# Patient Record
Sex: Female | Born: 1970 | Race: White | Hispanic: No | Marital: Married | Smoking: Never smoker
Health system: Southern US, Community
[De-identification: ages and names within clinical notes are randomized; demographics above are authoritative.]

## PROBLEM LIST (undated history)

## (undated) HISTORY — PX: WISDOM TOOTH EXTRACTION: SHX21

---

## 1998-03-10 ENCOUNTER — Other Ambulatory Visit: Admission: RE | Admit: 1998-03-10 | Discharge: 1998-03-10 | Payer: Self-pay | Admitting: Obstetrics and Gynecology

## 1998-06-19 ENCOUNTER — Other Ambulatory Visit: Admission: RE | Admit: 1998-06-19 | Discharge: 1998-06-19 | Payer: Self-pay | Admitting: Obstetrics and Gynecology

## 1998-12-02 ENCOUNTER — Other Ambulatory Visit: Admission: RE | Admit: 1998-12-02 | Discharge: 1998-12-02 | Payer: Self-pay | Admitting: Obstetrics and Gynecology

## 1999-12-02 ENCOUNTER — Other Ambulatory Visit: Admission: RE | Admit: 1999-12-02 | Discharge: 1999-12-02 | Payer: Self-pay | Admitting: Obstetrics and Gynecology

## 2000-11-03 ENCOUNTER — Other Ambulatory Visit: Admission: RE | Admit: 2000-11-03 | Discharge: 2000-11-03 | Payer: Self-pay | Admitting: Obstetrics and Gynecology

## 2001-11-05 ENCOUNTER — Other Ambulatory Visit: Admission: RE | Admit: 2001-11-05 | Discharge: 2001-11-05 | Payer: Self-pay | Admitting: *Deleted

## 2002-11-18 ENCOUNTER — Other Ambulatory Visit: Admission: RE | Admit: 2002-11-18 | Discharge: 2002-11-18 | Payer: Self-pay | Admitting: Obstetrics and Gynecology

## 2003-02-19 ENCOUNTER — Inpatient Hospital Stay (HOSPITAL_COMMUNITY): Admission: AD | Admit: 2003-02-19 | Discharge: 2003-02-19 | Payer: Self-pay | Admitting: Obstetrics and Gynecology

## 2003-05-20 ENCOUNTER — Ambulatory Visit (HOSPITAL_COMMUNITY): Admission: RE | Admit: 2003-05-20 | Discharge: 2003-05-20 | Payer: Self-pay | Admitting: Obstetrics and Gynecology

## 2003-07-18 ENCOUNTER — Inpatient Hospital Stay (HOSPITAL_COMMUNITY): Admission: AD | Admit: 2003-07-18 | Discharge: 2003-07-18 | Payer: Self-pay | Admitting: Obstetrics and Gynecology

## 2003-09-23 ENCOUNTER — Encounter (INDEPENDENT_AMBULATORY_CARE_PROVIDER_SITE_OTHER): Payer: Self-pay | Admitting: *Deleted

## 2003-09-23 ENCOUNTER — Inpatient Hospital Stay (HOSPITAL_COMMUNITY): Admission: AD | Admit: 2003-09-23 | Discharge: 2003-09-25 | Payer: Self-pay | Admitting: Obstetrics and Gynecology

## 2003-09-29 ENCOUNTER — Encounter: Admission: RE | Admit: 2003-09-29 | Discharge: 2003-10-29 | Payer: Self-pay | Admitting: Obstetrics and Gynecology

## 2003-11-05 ENCOUNTER — Other Ambulatory Visit: Admission: RE | Admit: 2003-11-05 | Discharge: 2003-11-05 | Payer: Self-pay | Admitting: Obstetrics and Gynecology

## 2004-04-19 ENCOUNTER — Encounter: Admission: RE | Admit: 2004-04-19 | Discharge: 2004-04-19 | Payer: Self-pay | Admitting: Family Medicine

## 2004-11-08 ENCOUNTER — Other Ambulatory Visit: Admission: RE | Admit: 2004-11-08 | Discharge: 2004-11-08 | Payer: Self-pay | Admitting: Obstetrics and Gynecology

## 2005-11-08 ENCOUNTER — Other Ambulatory Visit: Admission: RE | Admit: 2005-11-08 | Discharge: 2005-11-08 | Payer: Self-pay | Admitting: Obstetrics and Gynecology

## 2008-05-09 ENCOUNTER — Inpatient Hospital Stay (HOSPITAL_COMMUNITY): Admission: AD | Admit: 2008-05-09 | Discharge: 2008-05-09 | Payer: Self-pay | Admitting: Obstetrics and Gynecology

## 2008-07-29 ENCOUNTER — Inpatient Hospital Stay (HOSPITAL_COMMUNITY): Admission: AD | Admit: 2008-07-29 | Discharge: 2008-07-31 | Payer: Self-pay | Admitting: Obstetrics and Gynecology

## 2010-04-11 LAB — CBC
MCHC: 35.1 g/dL (ref 30.0–36.0)
MCV: 95.6 fL (ref 78.0–100.0)

## 2010-04-11 LAB — RH IMMUNE GLOB WKUP(>/=20WKS)(NOT WOMEN'S HOSP): Fetal Screen: NEGATIVE

## 2010-04-11 LAB — RPR: RPR Ser Ql: NONREACTIVE

## 2010-04-13 LAB — RH IMMUNE GLOBULIN WORKUP (NOT WOMEN'S HOSP): Antibody Screen: NEGATIVE

## 2010-05-18 NOTE — H&P (Signed)
NAMECAROLY, PUREWAL        ACCOUNT NO.:  [id number]   MEDICAL RECORD NO.:  [id number]          PATIENT TYPE:  INP   LOCATION:  9111                          FACILITY:  [facility]   PHYSICIAN:  Janine Limbo, M.D.DATE OF BIRTH:  [birth year]   DATE OF ADMISSION:  07/29/2008  DATE OF DISCHARGE:                              HISTORY & PHYSICAL   Ms. Cooper Render is a 40 year old gravida 2, para 1 who presents today at  39.[redacted] weeks gestation having been sent from the office to where she had a  labor check and was found to be 6 cm dilated. old gravida 2, para 1 who presents today at  39.[redacted] weeks gestation having been sent from the office to where she had a  labor check and was found to be 6 cm dilated.  Ms. Cooper Render is followed  by the midwives at [facility] and her pregnancy is  remarkable for:  1. Advanced maternal age.  2. Increased BMI.  3. Early previa, resolved.  4. Rh negative.  5. History of GBS with the first baby.   PRENATAL LABORATORY DATA:  [name]'s prenatal labs include an  initial hemoglobin of 12.4, hematocrit 37.4, platelets 270,000, blood  type A negative, antibody screen negative, RPR nonreactive, rubella  immune, hepatitis B negative, HIV nonreactive, first trimester screen  negative.  Maternal serum alpha fetoprotein normal, 26-week Glucola  normal at 73 mg/dL with a concurrent hemoglobin of 11.3 and a  nonreactive RPR also at 26 weeks, Rh antibody screen negative at 27.6  weeks, and a negative GBS culture at 35.6 weeks.   ALLERGIES:  Ms. Salvo does not have any allergies to medication,  food, or latex.   CURRENT MEDICATIONS:  Ms. Row current medications include  prenatal vitamins only.   HISTORY OF PRESENT PREGNANCY:  Ms. Gloeckner began her prenatal care  in mid December early in the first trimester at about 5-[redacted] weeks  gestation.  She was seen at 8 weeks with an ultrasound.  She had her  complete new OB exam at 10 weeks.  She declined amniocentesis.  She did  have a first trimester screen that was normal at 13.4 weeks.  She had an  AFP that was normal at 14.5 weeks.  She had an  early previa that was  resolved by the time of the anatomy scan at 18.6 weeks, showed size  concordant with dates and the placenta now in posterior position.  Cervix 4.07 cm and normal amount of fluid, all anatomy normal at 26.1  weeks.  She had a normal Glucola.  Repeat hemoglobin and RPR all of  which were normal.  She received her RhoGAM injection at 27.6 weeks.  At  times, she would measure size less to date due to the position of the  baby who would sometimes get a little transverse.  At 35.6 weeks, she  had a negative GBS culture.  At 38.4 weeks, she was again measuring size  less than dates and ultrasound was done, which showed an estimated fetal  weight of 8 pounds 0 ounces in the 78th percentile and amniotic fluid  index of 15.14, which was in the 60th percentile, and the fetal head to  be wedged very low in the pelvis that was last week, and today, she  presented for her routine visit having some contractions and  was found  to be 6 cm and was sent over from the office to be admitted for  delivery.   OBSTETRICAL HISTORY:  Ms. Hunger has been pregnant 2 times.  Gravida 1 resulted in birth of a daughter Maggy who was born in  September 2005 at [redacted] weeks gestation weighing 6 pounds and 5 ounces,  delivered without complications.  Gravida 2 is current pregnancy.   MEDICAL HISTORY:  Ms. Lasky is Rh negative.  She did get her  RhoGAM, last pregnancy.  She had a history of contraceptive use  including combined oral contraceptive pill.  She occasionally gets  bacterial vaginosis and had the usual childhood illnesses.   SURGICAL HISTORY:  Ms. Dabbs had a wisdom teeth extraction in  1992.   FAMILY HISTORY:  Ms. Quintela father has chronic hypertension.  Her mother has varicose veins.  Her paternal grandfather and paternal  aunt both have diabetes.  Her paternal aunt has an unspecified thyroid  dysfunction.   GENETIC HISTORY:  Ms. Schlatter had a  normal first trimester screen  and a normal AFP.  The father of the baby's mother has spina bifida.   SOCIAL HISTORY:  Ms. Spiller is a Caucasian and married to [facility].  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  He has a bachelor's degree and works as a Museum/gallery conservator.  He is also Caucasian.  She reports some minor alcohol use  before knowing that she was pregnant.  She denies use of tobacco or  street drugs during the pregnancy.  She declines to state a religious  preference.  The physical assessment today includes a stable vital  signs.  The patient is afebrile.  Fetal heart rate is normal with  variability present and 15 x 15 accelerations present meeting the  criteria for reactivity.  Contractions are every 3-5 minutes with soft  uterine resting tone.  Lungs are clear to auscultation bilaterally.  Heart, regular rate and rhythm.  No murmur.  No edema of extremities.  Normal DTRs.  Abdomen, gravid, appropriate for gestational age, soft  between contractions to palpation.  Vaginal exam in the office, she was  6 cm.  It was not repeated upon admission.   IMPRESSION:  33. A 40 year old gravida 2, para 1-0-0-1 at 39.[redacted] weeks gestation.  2. Reassuring fetal heart rate.  3. Active labor.  4. Rh negative.  5. Group B streptococcus negative.   PLAN:  Admit to [facility], routine labs.  Anticipate NSVD.      Eulogio Bear, CNM      Janine Limbo, M.D.  Electronically Signed    JM/MEDQ  D:  07/29/2008  T:  07/30/2008  Job:  [id number]

## 2010-05-21 NOTE — H&P (Signed)
NAME:  Kristin Fritz, Kristin Fritz                  ACCOUNT NO.:  [id number]   MEDICAL RECORD NO.:  [id number]                   PATIENT TYPE:  MAT   LOCATION:  [facility]C                                 F[facility]ILITY:  [facility]   PHYSICIAN:  Janine Limbo, M.D.            DATE OF BIRTH:  07/24/1970   DATE OF ADMISSION:  09/23/2003  DATE OF DISCHARGE:                                HISTORY & PHYSICAL   HISTORY OF PRESENT ILLNESS:  Ms. Murton is a 40 year old married  white female, primigravida at 37-5/7 weeks, who presents with regular  uterine contractions during the night.  She denies leaking, bleeding,  headache, nausea, vomiting or visual disturbances.  Her pregnancy has been  followed by the [facility] certified nurse midwife service and  has been remarkable for:  #1 - Long cycles, #2 - first trimester bleeding,  #3 - Rh-negative, #4 - father of the baby with family history of anomalies,  #5 - group B strep positive.  Her prenatal labs were collected on March 11, 2003:  Hemoglobin 13.0, hematocrit 36.4, platelets 275,000.  Blood type A-  negative, antibody positive.  Toxoplasmosis negative.  RPR nonreactive.  Rubella immune.  Hepatitis B surface antigen negative.  HIV nonreactive.  Her 1-hour Glucola from June 15, 2003 was 72 and her hemoglobin was 12.3.  Culture of the vaginal tract for group B strep, gonorrhea and Chlamydia on  September 05, 2003:  Group B strep was positive, gonorrhea and Chlamydia were  negative.   HISTORY OF PRESENT PREGNANCY:  The patient presented for care at [facility] on March 11, 2003 at 9-1/2 weeks' gestation.  She had had first  trimester bleeding a few weeks prior and received RhoGAM.  Genetic  counseling was scheduled with the patient and the father of the baby due to  the father of the baby's family history of congenital anomalies.  Visit with  the genetic counselor showed no increased risks of anomalies.  Pregnancy  ultrasonography at 19  weeks' gestation shows growth consistent with previous  dating and all anatomy seen and was normal.  The patient received RhoGAM at  29 weeks' gestation.  The rest of her prenatal care was unremarkable.   OBSTETRICAL HISTORY:  She is a primigravida.   ALLERGIES:  She has no medication allergies.   MEDICAL HISTORY:  1.  She has used oral contraceptives in the past and stopped in February      2004.  2.  She has had BV twice.  3.  She has indoor cats.  4.  She reports having had the usual childhood illnesses.  5.  She broke her right arm at the age of 5.   SURGICAL HISTORY:  Surgical history is remarkable for wisdom teeth  extraction at the age of 22.   FAMILY MEDICAL HISTORY:  Family medical history is remarkable for paternal  grandmother with stroke, paternal grandfather with MI, father with  hypertension, mom with varicose  veins, paternal grandfather and aunt with  diabetes, aunt with thyroid disease, father with history of depression,  mother and paternal grandfather are alcoholics and smokers   GENETIC HISTORY:  Genetic history is remarkable for father of the baby's  mother with scoliosis and spina bifida, father of the baby's mother also  born with clubfoot, father of the baby had a brother born with osteogenesis  imperfecta and hydrocephaly.   SOCIAL HISTORY:  The patient is married to the father of the baby; his name  is Gabriel Rung; he is involved and supportive.  They are both college-educated; she  is employed as an IT consultant; father of the baby is a Estate agent.  They deny any alcohol, tobacco or illicit drug use with the  pregnancy.   OBJECTIVE DATA:  VITAL SIGNS:  Vital signs are stable.  She is afebrile.  HEENT:  Grossly within normal limits.  CHEST:  Chest clear to auscultation.  HEART:  Regular rate and rhythm.  ABDOMEN:  Abdomen is gravid in contour with fundal height staying  approximately 37 cm above the pubic symphysis.  Fetal heart rate is  reactive  and reassuring.  Uterine contractions are every 4 minutes.  PELVIC:  Cervix is 7 cm, 100% effaced, vertex 0, with intact bag of waters.  EXTREMITIES:  Extremities are within normal limits.   ASSESSMENT:  1.  Intrauterine pregnancy at term.  2.  Active labor.  3.  Group B streptococcus positive.   PLAN:  Plan is to admit to birthing suites, although the patient will be  held in MAU until further notice due to a lack of rooms on labor and  delivery; Dr. Osborn Coho has been made aware.  We will begin penicillin  G for group B strep prophylaxis and the patient declines pain medications  for now.  We anticipate spontaneous vaginal delivery.      KS/MEDQ  D:  09/23/2003  T:  09/23/2003  Job:  [id number]

## 2011-01-14 ENCOUNTER — Other Ambulatory Visit: Payer: Self-pay | Admitting: Obstetrics and Gynecology

## 2011-01-14 DIAGNOSIS — N63 Unspecified lump in unspecified breast: Secondary | ICD-10-CM

## 2011-01-21 ENCOUNTER — Other Ambulatory Visit: Payer: Self-pay | Admitting: Obstetrics and Gynecology

## 2011-01-21 ENCOUNTER — Ambulatory Visit
Admission: RE | Admit: 2011-01-21 | Discharge: 2011-01-21 | Disposition: A | Discharge status: Home / Self Care | Payer: BC Managed Care – PPO | Source: Ambulatory Visit | Attending: Obstetrics and Gynecology | Admitting: Obstetrics and Gynecology

## 2011-01-21 DIAGNOSIS — N63 Unspecified lump in unspecified breast: Secondary | ICD-10-CM

## 2012-02-02 DIAGNOSIS — Z6791 Unspecified blood type, Rh negative: Secondary | ICD-10-CM | POA: Insufficient documentation

## 2012-02-03 ENCOUNTER — Encounter: Payer: Self-pay | Admitting: Obstetrics and Gynecology

## 2012-02-03 ENCOUNTER — Ambulatory Visit: Payer: BC Managed Care – PPO | Admitting: Obstetrics and Gynecology

## 2012-02-03 VITALS — BP 100/60 | Ht 67.5 in | Wt 182.0 lb

## 2012-02-03 DIAGNOSIS — Z6791 Unspecified blood type, Rh negative: Secondary | ICD-10-CM

## 2012-02-03 DIAGNOSIS — Z30430 Encounter for insertion of intrauterine contraceptive device: Secondary | ICD-10-CM | POA: Insufficient documentation

## 2012-02-03 NOTE — Progress Notes (Signed)
Subjective:    Kristin Fritz is a 42 y.o. female, G2P2, who presents for an annual exam.   Patient reports:  Doing well, except for persistent cold symptoms.  Children doing well.  Patient has Mirena, placed 10/02/2008--no cycles.    History   Social History  . Marital Status: Married    Spouse Name: N/A    Number of Children: N/A  . Years of Education: N/A   Social History Main Topics  . Smoking status: Never Smoker   . Smokeless tobacco: Never Used  . Alcohol Use: Yes     Comment: occasional wine  . Drug Use: No  . Sexually Active: Yes    Birth Control/ Protection: IUD   Other Topics Concern  . None   Social History Narrative  . None    Menstrual cycle:   LMP: No LMP recorded. Patient is not currently having periods (Reason: IUD).           Cycle: None since soon after Mirena placed.  The following portions of the patient's history were reviewed and updated as appropriate: allergies, current medications, past family history, past medical history, past social history, past surgical history and problem list.  Review of Systems Pertinent items are noted in HPI. Breast:Negative for breast lump,nipple discharge or nipple retraction Gastrointestinal: Negative for abdominal pain, change in bowel habits or rectal bleeding Urinary:negative   Objective:    BP 100/60  Ht 5' 7.5" (1.715 m)  Wt 182 lb (82.555 kg)  BMI 28.08 kg/m2    Weight:  Wt Readings from Last 1 Encounters:  02/03/12 182 lb (82.555 kg)          BMI: Body mass index is 28.08 kg/(m^2).  General Appearance: Alert, appropriate appearance for age. No acute distress HEENT: Grossly normal Neck / Thyroid: Supple, no masses, nodes or enlargement Lungs: clear to auscultation bilaterally Back: No CVA tenderness Breast Exam: No masses or nodes.No dimpling, nipple retraction or discharge. Cardiovascular: Regular rate and rhythm. S1, S2, no murmur Gastrointestinal: Soft, non-tender, no masses or  organomegaly Pelvic Exam: Vulva and vagina appear normal. Bimanual exam reveals normal uterus and adnexa.  Mirena string visible at os. Rectovaginal: normal rectal, no masses Lymphatic Exam: Non-palpable nodes in neck, clavicular, axillary, or inguinal regions Skin: no rash or abnormalities Neurologic: Normal gait and speech, no tremor  Psychiatric: Alert and oriented, appropriate affect.   Wet Prep:not applicable Urinalysis:not applicable UPT: Not done   Assessment:    Normal gyn exam  Mirena since 09/2008   Plan:    Mammogram: Yearly Pap:  Done today--may repeat in 2 years. STD screening: declined Contraception:IUD (Mirena) Other:  Will need removal and replacement of Mirena next year.      Nyra Capes, [place]

## 2012-02-03 NOTE — Progress Notes (Signed)
The patient reports:No complaints   Contraception:Mirena  Last mammogram: 01/21/2011 Normal Last pap: 01/14/2011 Normal  GC/Chlamydia cultures offered: declined HIV/RPR/HbsAg offered:  declined HSV 1 and 2 glycoprotein offered: declined  Menstrual cycle regular and monthly: No: Mirena Menstrual flow normal: No:   Urinary symptoms: none Normal bowel movements: Yes Reports abuse at home: No:

## 2012-02-06 LAB — PAP IG W/ RFLX HPV ASCU

## 2012-11-08 ENCOUNTER — Other Ambulatory Visit: Payer: Self-pay

## 2013-03-21 ENCOUNTER — Other Ambulatory Visit: Payer: Self-pay

## 2013-03-21 DIAGNOSIS — Z1231 Encounter for screening mammogram for malignant neoplasm of breast: Secondary | ICD-10-CM

## 2013-04-16 ENCOUNTER — Ambulatory Visit
Admission: RE | Admit: 2013-04-16 | Discharge: 2013-04-16 | Disposition: A | Discharge status: Home / Self Care | Payer: BC Managed Care – PPO | Source: Ambulatory Visit

## 2013-04-16 DIAGNOSIS — Z1231 Encounter for screening mammogram for malignant neoplasm of breast: Secondary | ICD-10-CM

## 2013-10-18 ENCOUNTER — Other Ambulatory Visit: Payer: Self-pay

## 2013-11-04 ENCOUNTER — Encounter: Payer: Self-pay | Admitting: Obstetrics and Gynecology

## 2015-10-01 ENCOUNTER — Other Ambulatory Visit: Payer: Self-pay | Admitting: Family Medicine

## 2015-10-01 DIAGNOSIS — N644 Mastodynia: Secondary | ICD-10-CM

## 2015-10-01 DIAGNOSIS — N63 Unspecified lump in unspecified breast: Secondary | ICD-10-CM

## 2015-10-06 ENCOUNTER — Ambulatory Visit
Admission: RE | Admit: 2015-10-06 | Discharge: 2015-10-06 | Disposition: A | Discharge status: Home / Self Care | Payer: 59 | Source: Ambulatory Visit | Attending: Family Medicine | Admitting: Family Medicine

## 2015-10-06 DIAGNOSIS — N644 Mastodynia: Secondary | ICD-10-CM

## 2015-10-06 DIAGNOSIS — N63 Unspecified lump in unspecified breast: Secondary | ICD-10-CM

## 2016-01-19 DIAGNOSIS — M545 Low back pain: Secondary | ICD-10-CM | POA: Diagnosis not present

## 2016-01-19 DIAGNOSIS — Z0000 Encounter for general adult medical examination without abnormal findings: Secondary | ICD-10-CM | POA: Diagnosis not present

## 2016-04-07 DIAGNOSIS — Z01411 Encounter for gynecological examination (general) (routine) with abnormal findings: Secondary | ICD-10-CM | POA: Diagnosis not present

## 2016-04-07 DIAGNOSIS — Z124 Encounter for screening for malignant neoplasm of cervix: Secondary | ICD-10-CM | POA: Diagnosis not present

## 2016-04-07 DIAGNOSIS — Z6823 Body mass index (BMI) 23.0-23.9, adult: Secondary | ICD-10-CM | POA: Diagnosis not present

## 2016-10-08 DIAGNOSIS — Z23 Encounter for immunization: Secondary | ICD-10-CM | POA: Diagnosis not present

## 2017-01-31 ENCOUNTER — Other Ambulatory Visit: Payer: Self-pay | Admitting: Family Medicine

## 2017-01-31 DIAGNOSIS — Z13220 Encounter for screening for lipoid disorders: Secondary | ICD-10-CM | POA: Diagnosis not present

## 2017-01-31 DIAGNOSIS — Z0000 Encounter for general adult medical examination without abnormal findings: Secondary | ICD-10-CM | POA: Diagnosis not present

## 2017-01-31 DIAGNOSIS — Z1231 Encounter for screening mammogram for malignant neoplasm of breast: Secondary | ICD-10-CM

## 2017-02-16 ENCOUNTER — Ambulatory Visit
Admission: RE | Admit: 2017-02-16 | Discharge: 2017-02-16 | Disposition: A | Discharge status: Home / Self Care | Payer: 59 | Source: Ambulatory Visit | Attending: Family Medicine | Admitting: Family Medicine

## 2017-02-16 DIAGNOSIS — Z1231 Encounter for screening mammogram for malignant neoplasm of breast: Secondary | ICD-10-CM

## 2017-04-11 DIAGNOSIS — Z6824 Body mass index (BMI) 24.0-24.9, adult: Secondary | ICD-10-CM | POA: Diagnosis not present

## 2017-04-11 DIAGNOSIS — Z01419 Encounter for gynecological examination (general) (routine) without abnormal findings: Secondary | ICD-10-CM | POA: Diagnosis not present

## 2017-11-11 DIAGNOSIS — M25512 Pain in left shoulder: Secondary | ICD-10-CM | POA: Diagnosis not present

## 2017-11-18 DIAGNOSIS — M25512 Pain in left shoulder: Secondary | ICD-10-CM | POA: Diagnosis not present

## 2017-11-24 DIAGNOSIS — M25512 Pain in left shoulder: Secondary | ICD-10-CM | POA: Diagnosis not present

## 2018-01-17 DIAGNOSIS — M7502 Adhesive capsulitis of left shoulder: Secondary | ICD-10-CM | POA: Diagnosis not present

## 2018-02-20 DIAGNOSIS — J101 Influenza due to other identified influenza virus with other respiratory manifestations: Secondary | ICD-10-CM | POA: Diagnosis not present

## 2018-02-20 DIAGNOSIS — R05 Cough: Secondary | ICD-10-CM | POA: Diagnosis not present

## 2018-02-26 DIAGNOSIS — Z13220 Encounter for screening for lipoid disorders: Secondary | ICD-10-CM | POA: Diagnosis not present

## 2018-02-26 DIAGNOSIS — Z0000 Encounter for general adult medical examination without abnormal findings: Secondary | ICD-10-CM | POA: Diagnosis not present

## 2018-02-26 DIAGNOSIS — E559 Vitamin D deficiency, unspecified: Secondary | ICD-10-CM | POA: Diagnosis not present

## 2018-03-19 DIAGNOSIS — M7502 Adhesive capsulitis of left shoulder: Secondary | ICD-10-CM | POA: Diagnosis not present

## 2018-09-24 ENCOUNTER — Other Ambulatory Visit: Payer: Self-pay

## 2018-09-24 DIAGNOSIS — Z20822 Contact with and (suspected) exposure to covid-19: Secondary | ICD-10-CM

## 2018-09-26 LAB — NOVEL CORONAVIRUS, NAA: SARS-CoV-2, NAA: NOT DETECTED

## 2019-01-23 DIAGNOSIS — Z1152 Encounter for screening for COVID-19: Secondary | ICD-10-CM | POA: Diagnosis not present

## 2019-01-23 DIAGNOSIS — Z20822 Contact with and (suspected) exposure to covid-19: Secondary | ICD-10-CM | POA: Diagnosis not present

## 2019-02-11 ENCOUNTER — Ambulatory Visit: Payer: BC Managed Care – PPO | Attending: Internal Medicine

## 2019-02-11 DIAGNOSIS — Z23 Encounter for immunization: Secondary | ICD-10-CM

## 2019-02-11 NOTE — Progress Notes (Signed)
   Covid-19 Vaccination Clinic  Name:  GENICE KIMBERLIN    MRN: [id number] DOB: 21-Dec-1970  02/11/2019  [name] was observed post Covid-19 immunization for 15 minutes without incidence. She was provided with Vaccine Information Sheet and instruction to access the V-Safe system.   Ms. Hawkey was instructed to call 911 with any severe reactions post vaccine: Marland Kitchen Difficulty breathing  . Swelling of your face and throat  . A fast heartbeat  . A bad rash all over your body  . Dizziness and weakness    Immunizations Administered    Name Date Dose VIS Date Route   Pfizer COVID-19 Vaccine 02/11/2019  5:15 PM 0.3 mL 12/14/2018 Intramuscular   Manufacturer: [employer], [employer]   Lot: [id number]   NDC: [address]-1

## 2019-03-08 ENCOUNTER — Ambulatory Visit: Payer: BC Managed Care – PPO | Attending: Internal Medicine

## 2019-03-08 DIAGNOSIS — Z23 Encounter for immunization: Secondary | ICD-10-CM

## 2019-03-08 NOTE — Progress Notes (Signed)
   Covid-19 Vaccination Clinic  Name:  Kristin Fritz    MRN: [id number] DOB: 1970-01-07  03/08/2019  [name] was observed post Covid-19 immunization for 15 minutes without incident. She was provided with Vaccine Information Sheet and instruction to access the V-Safe system.   [name] was instructed to call 911 with any severe reactions post vaccine: Marland Kitchen Difficulty breathing  . Swelling of face and throat  . A fast heartbeat  . A bad rash all over body  . Dizziness and weakness   Immunizations Administered    Name Date Dose VIS Date Route   Pfizer COVID-19 Vaccine 03/08/2019  3:51 PM 0.3 mL 12/14/2018 Intramuscular   Manufacturer: [employer], [employer]   Lot: [id number]   NDC: [address]-8

## 2019-04-23 ENCOUNTER — Other Ambulatory Visit: Payer: Self-pay | Admitting: Family Medicine

## 2019-04-23 DIAGNOSIS — Z1231 Encounter for screening mammogram for malignant neoplasm of breast: Secondary | ICD-10-CM

## 2019-05-07 ENCOUNTER — Other Ambulatory Visit: Payer: Self-pay

## 2019-05-07 ENCOUNTER — Ambulatory Visit
Admission: RE | Admit: 2019-05-07 | Discharge: 2019-05-07 | Disposition: A | Discharge status: Home / Self Care | Payer: 59 | Source: Ambulatory Visit | Attending: Family Medicine | Admitting: Family Medicine

## 2019-05-07 DIAGNOSIS — Z1231 Encounter for screening mammogram for malignant neoplasm of breast: Secondary | ICD-10-CM

## 2021-09-23 IMAGING — MG DIGITAL SCREENING BILAT W/ TOMO W/ CAD
8 series · 9 of 24 positions shown · non-contrast
Comparison: Previous exam(s).

CLINICAL DATA: Screening.

EXAM:
DIGITAL SCREENING BILATERAL MAMMOGRAM WITH TOMO AND CAD

[L MLO synth-2D]
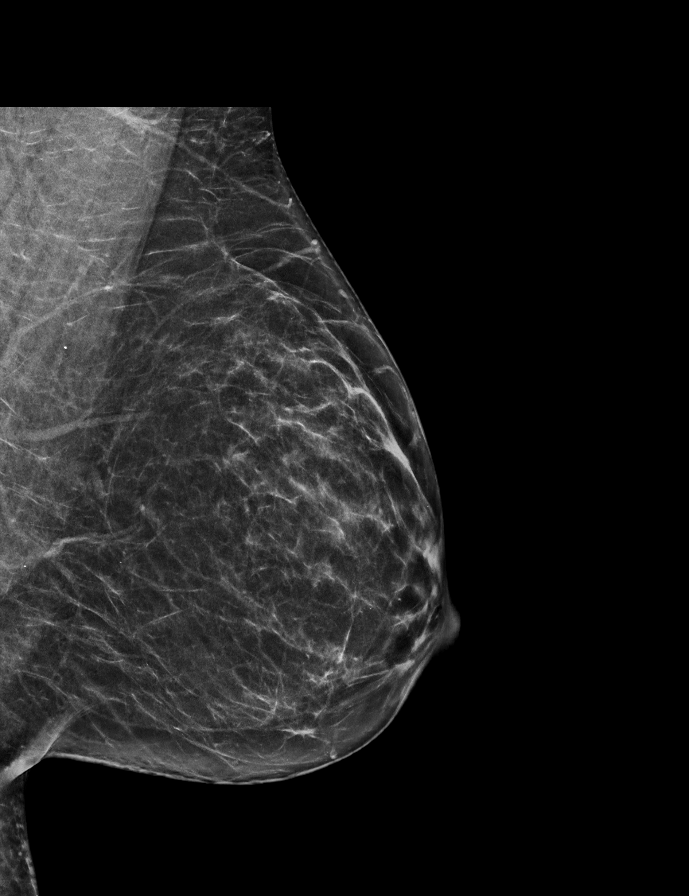

[R MLO synth-2D]
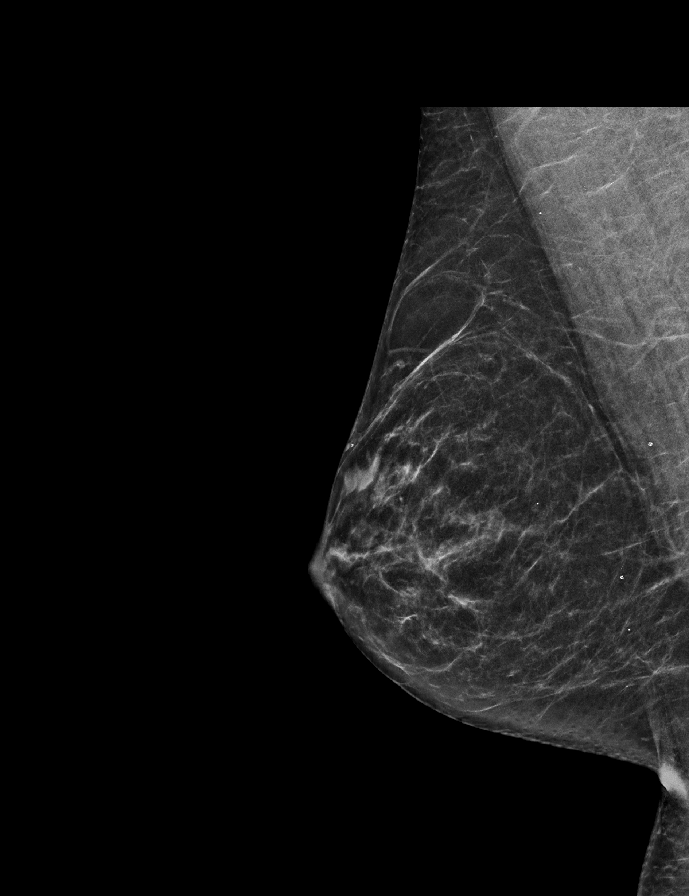

[R CC synth-2D]
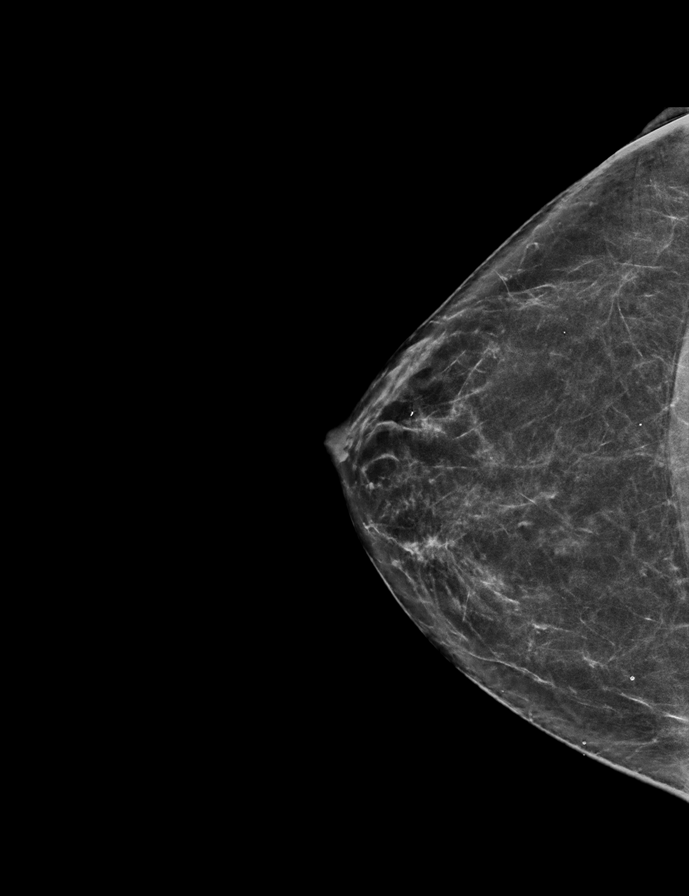

[L CC synth-2D]
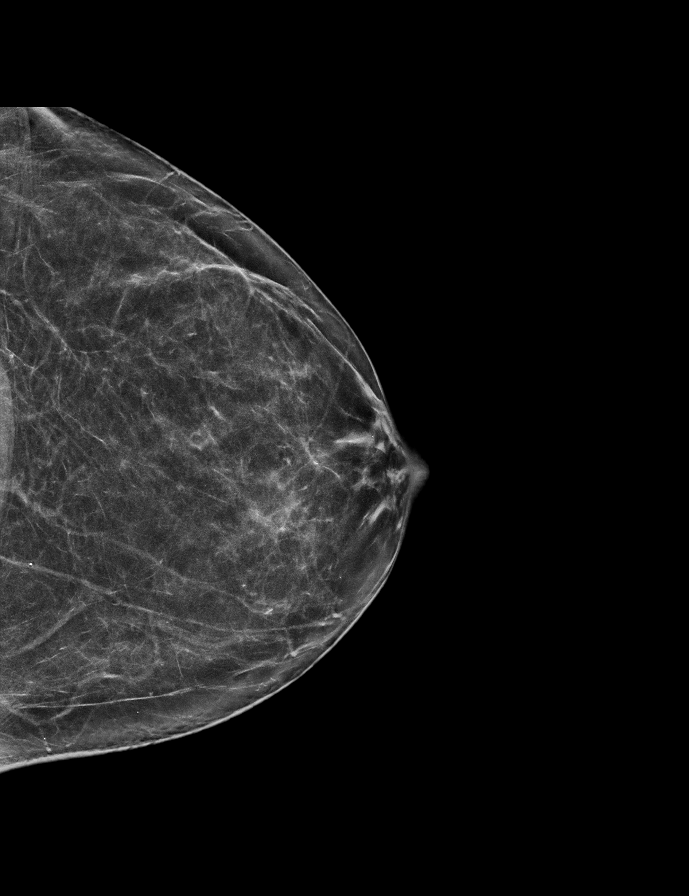

[L MLO tomo · 2 of 62 frames shown]
[frame 21/62]
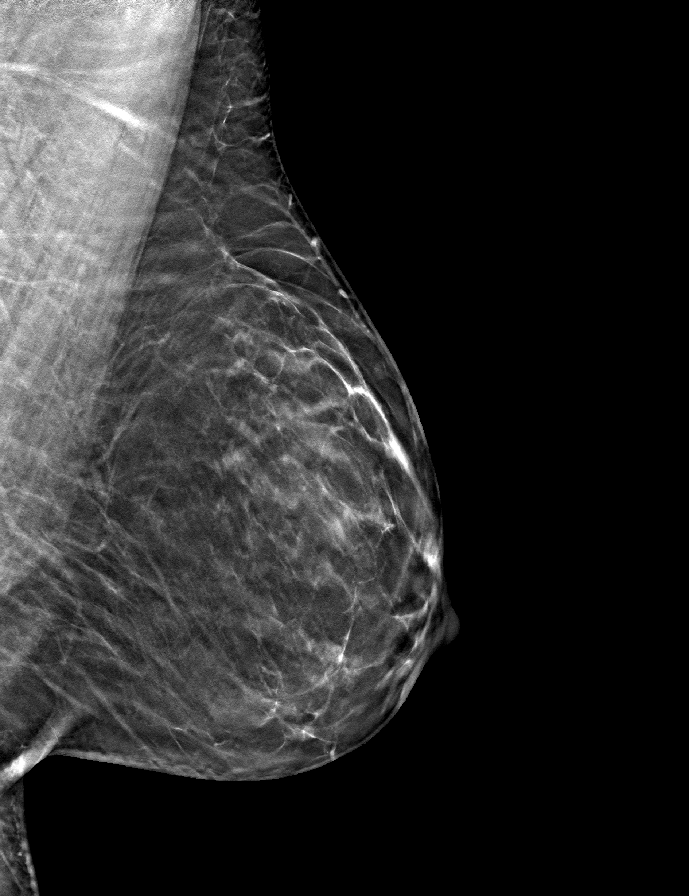
[frame 31/62]
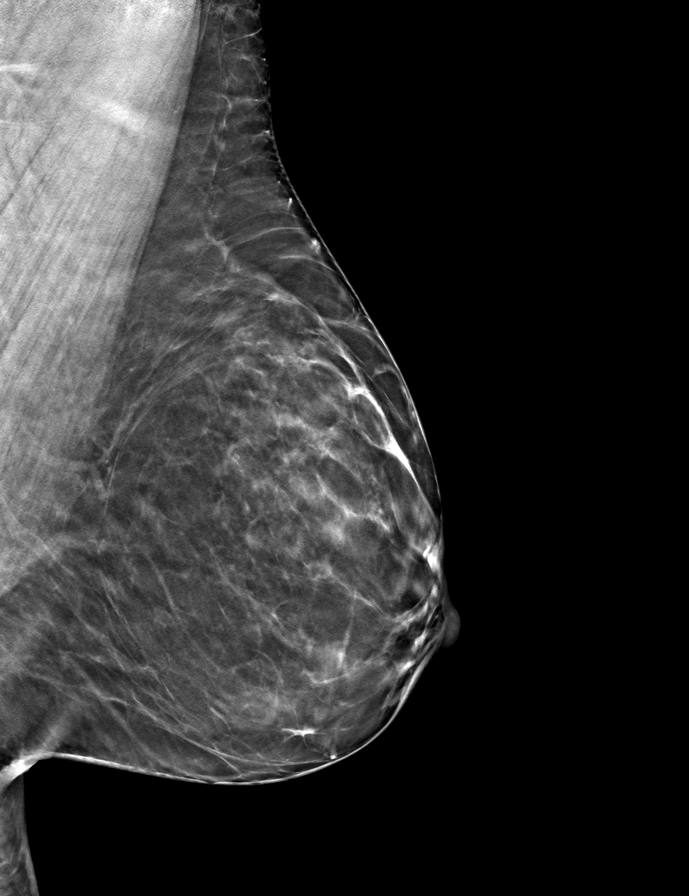

[R CC tomo · tomo slice 31/61.0]
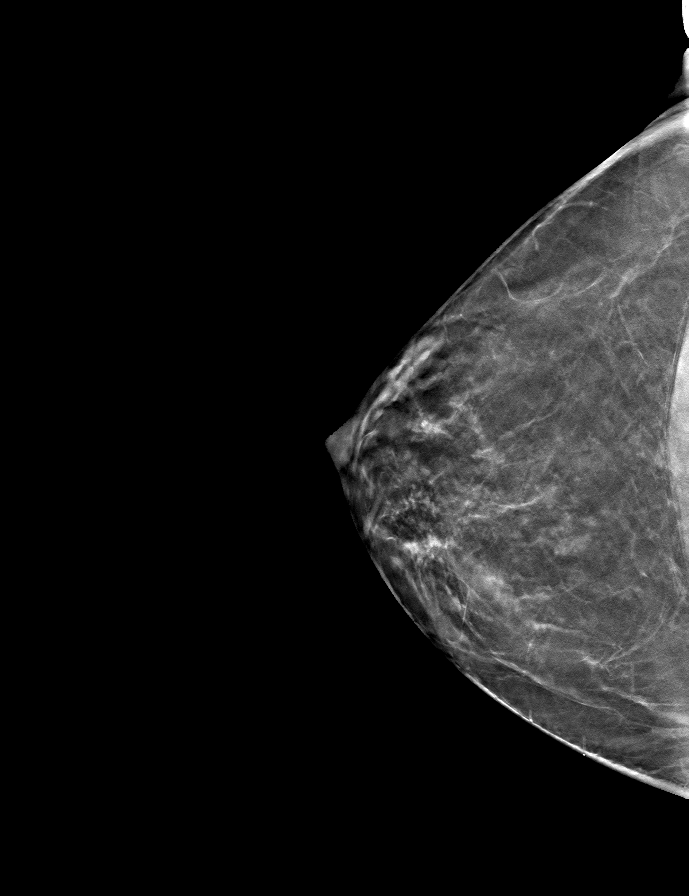

[L CC tomo · tomo slice 30/59.0]
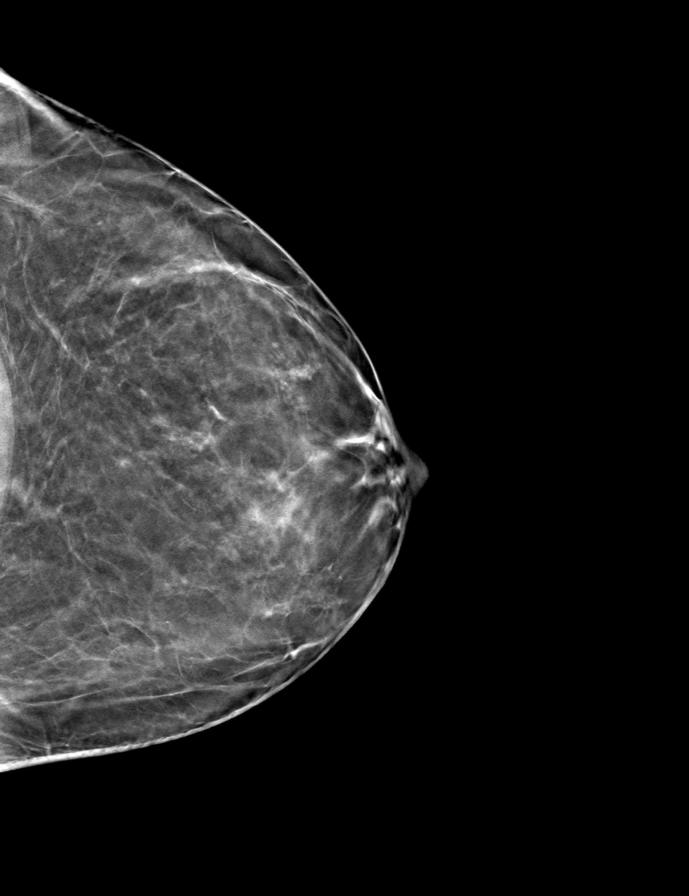

[R MLO tomo · tomo slice 29/56.0]
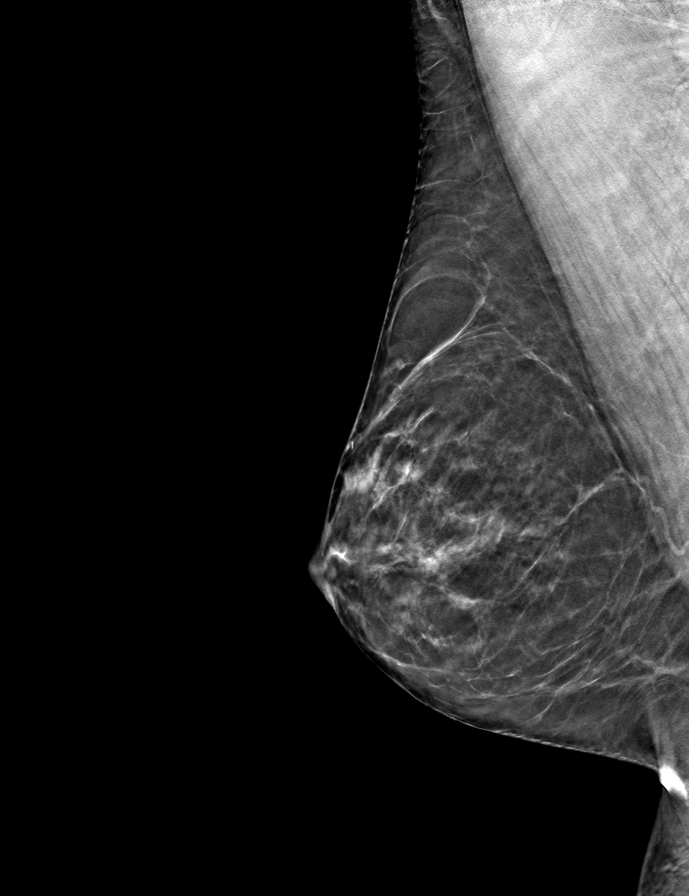

[9 of 24 positions shown; findings below may reference images not displayed]

ACR Breast Density Category b: There are scattered areas of
fibroglandular density.
FINDINGS: There are no findings suspicious for malignancy. Images were
processed with CAD.
IMPRESSION: No mammographic evidence of malignancy. A result letter of this
screening mammogram will be mailed directly to the patient.

RECOMMENDATION:
Screening mammogram in one year. (Code:[id number])

BI-RADS CATEGORY  1: Negative.

## 2022-07-21 ENCOUNTER — Other Ambulatory Visit: Payer: Self-pay | Admitting: Obstetrics and Gynecology

## 2022-07-21 DIAGNOSIS — N6489 Other specified disorders of breast: Secondary | ICD-10-CM

## 2022-08-08 ENCOUNTER — Ambulatory Visit
Admission: RE | Admit: 2022-08-08 | Discharge: 2022-08-08 | Disposition: A | Discharge status: Home / Self Care | Payer: 59 | Source: Ambulatory Visit | Attending: Obstetrics and Gynecology | Admitting: Obstetrics and Gynecology

## 2022-08-08 ENCOUNTER — Ambulatory Visit
Admission: RE | Admit: 2022-08-08 | Discharge: 2022-08-08 | Disposition: A | Discharge status: Home / Self Care | Payer: Managed Care, Other (non HMO) | Source: Ambulatory Visit | Attending: Obstetrics and Gynecology | Admitting: Obstetrics and Gynecology

## 2022-08-08 DIAGNOSIS — N6489 Other specified disorders of breast: Secondary | ICD-10-CM

## 2022-10-14 ENCOUNTER — Other Ambulatory Visit (HOSPITAL_BASED_OUTPATIENT_CLINIC_OR_DEPARTMENT_OTHER): Payer: Self-pay

## 2022-10-14 MED ORDER — INFLUENZA VIRUS VACC SPLIT PF (FLUZONE) 0.5 ML IM SUSY
0.5000 mL | PREFILLED_SYRINGE | Freq: Once | INTRAMUSCULAR | 0 refills | Status: AC
  Filled 2022-10-14: qty 0.5, 1d supply, fill #0

## 2022-10-14 MED ORDER — COVID-19 MRNA VAC-TRIS(PFIZER) 30 MCG/0.3ML IM SUSY
0.3000 mL | PREFILLED_SYRINGE | Freq: Once | INTRAMUSCULAR | 0 refills | Status: AC
  Filled 2022-10-14: qty 0.3, 1d supply, fill #0
